# Patient Record
Sex: Female | Born: 1950 | Race: White | Hispanic: No | Marital: Married | Smoking: Never smoker
Health system: Southern US, Community
[De-identification: ages and names within clinical notes are randomized; demographics above are authoritative.]

## PROBLEM LIST (undated history)

## (undated) DIAGNOSIS — C801 Malignant (primary) neoplasm, unspecified: Secondary | ICD-10-CM

## (undated) DIAGNOSIS — I429 Cardiomyopathy, unspecified: Secondary | ICD-10-CM

## (undated) HISTORY — DX: Malignant (primary) neoplasm, unspecified: C80.1

## (undated) HISTORY — PX: BREAST SURGERY: SHX581

---

## 2013-04-05 ENCOUNTER — Ambulatory Visit: Payer: BC Managed Care – PPO | Admitting: Physician Assistant

## 2013-04-05 VITALS — BP 112/64 | HR 102 | Temp 99.2°F | Resp 18 | Ht 61.0 in | Wt 123.0 lb

## 2013-04-05 DIAGNOSIS — J069 Acute upper respiratory infection, unspecified: Secondary | ICD-10-CM

## 2013-04-05 DIAGNOSIS — B9789 Other viral agents as the cause of diseases classified elsewhere: Secondary | ICD-10-CM

## 2013-04-05 DIAGNOSIS — H109 Unspecified conjunctivitis: Secondary | ICD-10-CM

## 2013-04-05 DIAGNOSIS — H1089 Other conjunctivitis: Secondary | ICD-10-CM

## 2013-04-05 MED ORDER — BENZONATATE 100 MG PO CAPS: 100.0000 mg | ORAL_CAPSULE | Freq: Three times a day (TID) | ORAL | Status: DC | PRN

## 2013-04-05 MED ORDER — POLYMYXIN B-TRIMETHOPRIM 10000-0.1 UNIT/ML-% OP SOLN: 1.0000 [drp] | OPHTHALMIC | Status: DC

## 2013-04-05 NOTE — Progress Notes (Signed)
   Subjective:    Patient ID: Jodi Nguyen, female    DOB: 1950/04/22, 63 y.o.   MRN: [id number]  HPI   Jodi Nguyen is a very pleasant 63 yr old female here with concern for pink eye.  Woke up this morning with left eye crusted shut.  Eye is red and draining goopy stuff.  Feels scratchy, but not painful.  Some blurred vision but thinks this is likely due to drainage.  No FB to the eye.  No contact lenses.  No known sick contacts  Also thinks she may have sinusitis.  Started blowing green stuff out of nose today.  Has non-productive cough.  Some sore throat, though seems to be resolving.  No fever.  Symptoms started 3 days ago.  Lives in [place], en route to the [place] for a work meeting  Review of Systems  Constitutional: Negative for fever and chills.  HENT: Positive for congestion, rhinorrhea and sore throat. Negative for ear pain.   Eyes: Positive for discharge, redness and itching. Negative for photophobia and visual disturbance.  Respiratory: Positive for cough. Negative for shortness of breath and wheezing.   Cardiovascular: Negative.   Gastrointestinal: Negative.   Musculoskeletal: Negative.   Skin: Negative.        Objective:   Physical Exam  Vitals reviewed. Constitutional: She is oriented to person, place, and time. She appears well-developed and well-nourished. No distress.  HENT:  Head: Normocephalic and atraumatic.  Right Ear: Tympanic membrane and ear canal normal.  Left Ear: Tympanic membrane and ear canal normal.  Nose: Mucosal edema and rhinorrhea present.  Mouth/Throat: Uvula is midline, oropharynx is clear and moist and mucous membranes are normal.  Eyes: EOM are normal. Pupils are equal, round, and reactive to light. Right eye exhibits no discharge. No foreign body present in the right eye. Left eye exhibits discharge. No foreign body present in the left eye. Left conjunctiva is injected.  Copious purulent drainage from left eye; lashes matted; no evidence  of scratch or ulceration on fluorescein exam  Neck: Neck supple.  Cardiovascular: Normal rate, regular rhythm and normal heart sounds.   Pulmonary/Chest: Effort normal and breath sounds normal. She has no wheezes. She has no rales.  Lymphadenopathy:    She has no cervical adenopathy.  Neurological: She is alert and oriented to person, place, and time.  Skin: Skin is warm and dry.  Psychiatric: She has a normal mood and affect. Her behavior is normal.        Assessment & Plan:  Bacterial conjunctivitis of left eye - Plan: trimethoprim-polymyxin b (POLYTRIM) ophthalmic solution  Viral URI with cough - Plan: benzonatate (TESSALON) 100 MG capsule   Jodi Nguyen is a very pleasant 63 yr old female with bacterial conjunctivitis.  Will start polytrim drops today.  Encourage minimal touching of the eye, frequent hand washing.  Suspect other symptoms are due to viral URI.  Encouraged antihist. Pt declines nasal spray.  Rx for Tessalon perles if needed.  Push fluid, rest.  Call or RTC if worsening or not improving   E. Natividad Brood MHS, PA-C [facility] 1/9/20156:40 PM

## 2013-04-05 NOTE — Patient Instructions (Signed)
Begin using the Polytrim - 1 drop every 4 hours while awake until symptoms resolve, and then 1-2 days past that  Try to avoid touching the eye.  Wash hands frequently  Begin taking an allergy medicine (Zyrtec, Claritin, Allegra) to help with congestion.  Continue Mucinex.  Use the Tessalon Perles if needed for cough.  Drink plenty of fluids - water is best! And get plenty of rest  Please let us know if any symptoms are worsening or not improving   Bacterial Conjunctivitis Bacterial conjunctivitis, commonly called pink eye, is an inflammation of the clear membrane that covers the white part of the eye (conjunctiva). The inflammation can also happen on the underside of the eyelids. The blood vessels in the conjunctiva become inflamed causing the eye to become red or pink. Bacterial conjunctivitis may spread easily from one eye to another and from person to person (contagious).  CAUSES  Bacterial conjunctivitis is caused by bacteria. The bacteria may come from your own skin, your upper respiratory tract, or from someone else with bacterial conjunctivitis. SYMPTOMS  The normally white color of the eye or the underside of the eyelid is usually pink or red. The pink eye is usually associated with irritation, tearing, and some sensitivity to light. Bacterial conjunctivitis is often associated with a thick, yellowish discharge from the eye. The discharge may turn into a crust on the eyelids overnight, which causes your eyelids to stick together. If a discharge is present, there may also be some blurred vision in the affected eye. DIAGNOSIS  Bacterial conjunctivitis is diagnosed by your caregiver through an eye exam and the symptoms that you report. Your caregiver looks for changes in the surface tissues of your eyes, which may point to the specific type of conjunctivitis. A sample of any discharge may be collected on a cotton-tip swab if you have a severe case of conjunctivitis, if your cornea is affected,  or if you keep getting repeat infections that do not respond to treatment. The sample will be sent to a lab to see if the inflammation is caused by a bacterial infection and to see if the infection will respond to antibiotic medicines. TREATMENT   Bacterial conjunctivitis is treated with antibiotics. Antibiotic eyedrops are most often used. However, antibiotic ointments are also available. Antibiotics pills are sometimes used. Artificial tears or eye washes may ease discomfort. HOME CARE INSTRUCTIONS   To ease discomfort, apply a cool, clean wash cloth to your eye for 10 20 minutes, 3 4 times a day.  Gently wipe away any drainage from your eye with a warm, wet washcloth or a cotton ball.  Wash your hands often with soap and water. Use paper towels to dry your hands.  Do not share towels or wash cloths. This may spread the infection.  Change or wash your pillow case every day.  You should not use eye makeup until the infection is gone.  Do not operate machinery or drive if your vision is blurred.  Stop using contacts lenses. Ask your caregiver how to sterilize or replace your contacts before using them again. This depends on the type of contact lenses that you use.  When applying medicine to the infected eye, do not touch the edge of your eyelid with the eyedrop bottle or ointment tube. SEEK IMMEDIATE MEDICAL CARE IF:   Your infection has not improved within 3 days after beginning treatment.  You had yellow discharge from your eye and it returns.  You have increased eye pain.  Your eye redness is spreading.  Your vision becomes blurred.  You have a fever or persistent symptoms for more than 2 3 days.  You have a fever and your symptoms suddenly get worse.  You have facial pain, redness, or swelling. MAKE SURE YOU:   Understand these instructions.  Will watch your condition.  Will get help right away if you are not doing well or get worse. Document Released: 03/14/2005  Document Revised: 12/07/2011 Document Reviewed: 08/15/2011 [facility] Patient Information 2014 [place], [place].

## 2015-02-27 ENCOUNTER — Emergency Department (HOSPITAL_COMMUNITY)
Admission: EM | Admit: 2015-02-27 | Discharge: 2015-02-27 | Disposition: A | Discharge status: Home / Self Care | Payer: Managed Care, Other (non HMO) | Attending: Emergency Medicine | Admitting: Emergency Medicine

## 2015-02-27 ENCOUNTER — Emergency Department (HOSPITAL_COMMUNITY): Payer: Managed Care, Other (non HMO)

## 2015-02-27 ENCOUNTER — Encounter (HOSPITAL_COMMUNITY): Payer: Self-pay | Admitting: Emergency Medicine

## 2015-02-27 DIAGNOSIS — Z880 Allergy status to penicillin: Secondary | ICD-10-CM | POA: Diagnosis not present

## 2015-02-27 DIAGNOSIS — Z79899 Other long term (current) drug therapy: Secondary | ICD-10-CM | POA: Insufficient documentation

## 2015-02-27 DIAGNOSIS — S199XXA Unspecified injury of neck, initial encounter: Secondary | ICD-10-CM | POA: Insufficient documentation

## 2015-02-27 DIAGNOSIS — Z859 Personal history of malignant neoplasm, unspecified: Secondary | ICD-10-CM | POA: Insufficient documentation

## 2015-02-27 DIAGNOSIS — Y998 Other external cause status: Secondary | ICD-10-CM | POA: Diagnosis not present

## 2015-02-27 DIAGNOSIS — Y9389 Activity, other specified: Secondary | ICD-10-CM | POA: Insufficient documentation

## 2015-02-27 DIAGNOSIS — R079 Chest pain, unspecified: Secondary | ICD-10-CM

## 2015-02-27 DIAGNOSIS — Y92410 Unspecified street and highway as the place of occurrence of the external cause: Secondary | ICD-10-CM | POA: Diagnosis not present

## 2015-02-27 DIAGNOSIS — M542 Cervicalgia: Secondary | ICD-10-CM

## 2015-02-27 DIAGNOSIS — S29001A Unspecified injury of muscle and tendon of front wall of thorax, initial encounter: Secondary | ICD-10-CM | POA: Diagnosis present

## 2015-02-27 DIAGNOSIS — R011 Cardiac murmur, unspecified: Secondary | ICD-10-CM | POA: Diagnosis not present

## 2015-02-27 HISTORY — DX: Cardiomyopathy, unspecified: I42.9

## 2015-02-27 NOTE — Discharge Instructions (Signed)
Chest Wall Pain Chest wall pain is pain in or around the bones and muscles of your chest. Sometimes, an injury causes this pain. Sometimes, the cause may not be known. This pain may take several weeks or longer to get better. HOME CARE INSTRUCTIONS  Pay attention to any changes in your symptoms. Take these actions to help with your pain:   Rest as told by your health care provider.   Avoid activities that cause pain. These include any activities that use your chest muscles or your abdominal and side muscles to lift heavy items.   If directed, apply ice to the painful area:  Put ice in a plastic bag.  Place a towel between your skin and the bag.  Leave the ice on for 20 minutes, 2-3 times per day.  Take over-the-counter and prescription medicines only as told by your health care provider.  Do not use tobacco products, including cigarettes, chewing tobacco, and e-cigarettes. If you need help quitting, ask your health care provider.  Keep all follow-up visits as told by your health care provider. This is important. SEEK MEDICAL CARE IF:  You have a fever.  Your chest pain becomes worse.  You have new symptoms. SEEK IMMEDIATE MEDICAL CARE IF:  You have nausea or vomiting.  You feel sweaty or light-headed.  You have a cough with phlegm (sputum) or you cough up blood.  You develop shortness of breath.   This information is not intended to replace advice given to you by your health care provider. Make sure you discuss any questions you have with your health care provider.   Document Released: 03/14/2005 Document Revised: 12/03/2014 Document Reviewed: 06/09/2014 Elsevier Interactive Patient Education [employer].   Please contact her primary care today and inform them of today's visit and all relevant data including CT scan. Please schedule follow-up evaluation soon as possible for further evaluation and management. If you have any new or worsening signs or symptoms  please return immediately to the emergency room for further evaluation and management.
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
## 2015-02-27 NOTE — ED Notes (Signed)
Patient transported to X-ray 

## 2015-02-27 NOTE — ED Notes (Signed)
Pt transported to xray 
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
## 2023-11-07 ENCOUNTER — Other Ambulatory Visit: Payer: Self-pay | Admitting: Medical Genetics

## 2023-12-11 ENCOUNTER — Other Ambulatory Visit (HOSPITAL_COMMUNITY)

## 2024-01-03 ENCOUNTER — Other Ambulatory Visit (HOSPITAL_COMMUNITY)
Admission: RE | Admit: 2024-01-03 | Discharge: 2024-01-03 | Disposition: A | Discharge status: Home / Self Care | Payer: Self-pay | Source: Ambulatory Visit | Attending: Medical Genetics | Admitting: Medical Genetics

## 2024-01-13 LAB — GENECONNECT MOLECULAR SCREEN: Genetic Analysis Overall Interpretation: NEGATIVE
